# Patient Record
Sex: Male | Born: 1945 | Race: White | Hispanic: No | Marital: Married
Health system: Southern US, Community
[De-identification: ages and names within clinical notes are randomized; demographics above are authoritative.]

## PROBLEM LIST (undated history)

## (undated) DIAGNOSIS — I10 Essential (primary) hypertension: Secondary | ICD-10-CM

## (undated) HISTORY — PX: KNEE SURGERY: SHX244

## (undated) HISTORY — PX: APPENDECTOMY: SHX54

---

## 2001-12-12 ENCOUNTER — Encounter (INDEPENDENT_AMBULATORY_CARE_PROVIDER_SITE_OTHER): Payer: Self-pay | Admitting: *Deleted

## 2001-12-13 ENCOUNTER — Encounter: Payer: Self-pay | Admitting: Emergency Medicine

## 2001-12-13 ENCOUNTER — Observation Stay (HOSPITAL_COMMUNITY): Admission: EM | Admit: 2001-12-13 | Discharge: 2001-12-14 | Payer: Self-pay | Admitting: Emergency Medicine

## 2003-02-06 ENCOUNTER — Inpatient Hospital Stay (HOSPITAL_COMMUNITY): Admission: EM | Admit: 2003-02-06 | Discharge: 2003-02-09 | Payer: Self-pay | Admitting: *Deleted

## 2003-03-15 ENCOUNTER — Encounter: Admission: RE | Admit: 2003-03-15 | Discharge: 2003-03-15 | Payer: Self-pay | Admitting: Family Medicine

## 2003-12-28 ENCOUNTER — Ambulatory Visit (HOSPITAL_COMMUNITY): Admission: RE | Admit: 2003-12-28 | Discharge: 2003-12-28 | Payer: Self-pay | Admitting: Orthopedic Surgery

## 2005-10-25 ENCOUNTER — Ambulatory Visit (HOSPITAL_COMMUNITY): Admission: RE | Admit: 2005-10-25 | Discharge: 2005-10-25 | Payer: Self-pay | Admitting: Gastroenterology

## 2009-06-17 ENCOUNTER — Ambulatory Visit (HOSPITAL_COMMUNITY): Admission: RE | Admit: 2009-06-17 | Discharge: 2009-06-17 | Payer: Self-pay | Admitting: Specialist

## 2009-06-21 ENCOUNTER — Encounter: Admission: RE | Admit: 2009-06-21 | Discharge: 2009-06-21 | Payer: Self-pay | Admitting: Specialist

## 2011-01-19 NOTE — H&P (Signed)
[place]. [facility]  Patient:    Geoffrey Logan, Geoffrey Logan Visit Number: [id number] MRN: [id number]          Service Type: SUR Location: 5700 [id number] Attending Physician:  Arlis Porta Dictated by:   Adolph Pollack, M.D. Admit Date:  12/12/2001 Discharge Date: 12/14/2001   CC:         Delorse Lek, M.D.   History and Physical  CHIEF COMPLAINT: Right mid abdominal pain.  HISTORY OF PRESENT ILLNESS: Geoffrey Logan is a 65 year old male, who states he felt "queasy" last night around 7-8 p.m.  He had something to eat, and began developing worsening pain in the right mid abdomen.  It got so severe he presented to the emergency department.  Extensive evaluation ensued, including gallbladder ultrasound which was negative.  WBC was slightly elevated.  Liver function tests were normal.  He subsequently underwent a CT scan which demonstrated inflammatory changes and thickening of the appendix, which actually was in the right mid abdomen in the right upper quadrant region.  At this point I was asked to see him.  He has had some nausea and vomiting.  PAST MEDICAL HISTORY:  1. Hypertension.  2. Gastroesophageal reflux disease.  3. Nephrolithiasis.  4. Hypercholesterolemia.  PAST SURGICAL HISTORY:  1. Tonsillectomy.  2. Circumcision.  MEDICAL ALLERGIES: SULFA.  MEDICATIONS:  1. Accupril.  2. Aspirin.  3. Niacin.  4. Zantac.  SOCIAL HISTORY: He is married.  He is retired from the [employer].  He denies tobacco or alcohol use.  FAMILY HISTORY: Positive for heart disease in his mother.  Also positive for sugar diabetes.  REVIEW OF SYSTEMS: CARDIOVASCULAR: He states he had a dysrhythmia in the past. No known heart disease.  PULMONARY: No asthma, pneumonia, TB.  GI: No hepatitis, diverticulitis, or jaundice.  ENDOCRINE: No diabetes or thyroid disease.  NEUROLOGIC: No strokes or seizures.  HEMATOLOGIC: No bleeding disorders, blood  clots, or previous transfusions.  PHYSICAL EXAMINATION:  GENERAL: Well-developed, well-nourished male in no acute distress, very pleasant and cooperative.  VITAL SIGNS: Temperature 96.7 degrees, blood pressure 166/82, pulse 50.  HEENT: EOMI.  No icterus.  NECK: Supple without masses.  CARDIOVASCULAR: Heart demonstrates decreased rate with regular rhythm.  RESPIRATORY: Breath sounds equal and clear.  Respirations unlabored.  ABDOMEN: Soft, with mild right mid abdominal tenderness present.  No palpable masses.  No organomegaly present.  EXTREMITIES: Full range of motion.  No cyanosis or edema.  LABORATORY DATA: CT scan demonstrates a retrocecal periappendix in the right upper quadrant region that is thickened, with inflammatory change and an appendicolith is noted as well.  Laboratory data remarkable for WBC of 10,900 with hemoglobin of 14, platelet count 230,000.  Liver function tests normal.  Amylase and lipase normal.  EKG demonstrates sinus bradycardia.  IMPRESSION: Acute appendicitis with slight malrotation.  PLAN: Laparoscopic, possible open, appendectomy.  I did explain the procedure, rationale, and risks to him.  The risks include, but not limited to, bleeding, infection, risks of anesthesia, and accidental injury to abdominal organs including liver and intestine.  He seems to understand this and agreed to proceed. Dictated by:   Adolph Pollack, M.D. Attending Physician:  Arlis Porta DD:  12/13/01 TD:  12/13/01 Job: [id number]

## 2011-01-19 NOTE — Discharge Summary (Signed)
   NAMELUCIA, Geoffrey Logan                          ACCOUNT NO.:  [id number]   MEDICAL RECORD NO.:  [id number]                   PATIENT TYPE:  INP   LOCATION:  5712                                 FACILITY:  [facility]   PHYSICIAN:  Noelle C. Merilynn Finland, M.D.           DATE OF BIRTH:  16-May-1946   DATE OF ADMISSION:  02/06/2003  DATE OF DISCHARGE:  02/09/2003                                 DISCHARGE SUMMARY   MEDICATION LIST:  1. Keflex 500 mg one tab by mouth four times a day for six days to finish     ten day course.  2. Accupril 40 mg one tab by mouth every day.  3. Niaspan 1,000 mg one tab by mouth at bedtime every day.  4. Aspirin 81 mg one tab by mouth every day 30 minutes prior to Niaspan.  5. Ibuprofen 800 mg one tab by mouth every eight hours as needed for pain or     inflammation.   DISCHARGE DIAGNOSES:  1. Right prepatellar bursitis with superficial cellulitis.  2. Hypertension.  3. Gastroesophageal reflux disease.  4. Hyperlipidemia.   DISCHARGE INSTRUCTIONS:  1. No heavy exertion until pain and swelling resolve, then may return to     work.  2. Use pain medications prescribed by Dr. Cleta Alberts.  3. Go to emergency room/call primary medical doctor if knee swells, fever,     shortness of breath, or chest pain occur.  4. Patient to call primary medical doctor, Dr. Doristine Counter, for an appointment     to followup within one weeks' time after discharge.   This is a 65 year old Caucasian male with a history of hypertension, GERD,  hyperlipidemia and presenting with right prepatellar bursitis with  superficial cellulitis.   PROBLEM LIST:  1. Right prepatellar bursitis with superficial cellulitis.  Patient with low     grade fever of 100.7 and leukocytosis at 12.9, treated with IV Ancef and     Tylenol 3 for pain.  Now patient with decreased right knee swelling and     afebrile for two days with improving leukocytosis within normal limits to     11.  Patient placed on p.o. antibiotics,  Keflex 500 mg p.o. q.i.d.  No     arthrocentesis performed.  Right knee kept elevated and ice pack applied     24/7.  Ibuprofen 600 mg to control inflammation/pain.  2. Hypertension controlled on lisinopril.  3.     Hyperlipidemia controlled on Niaspan.  4. Gastroesophageal reflux disease controlled on Pepcid.   Followup appointment to be made by patient at his primary care doctor within  one weeks' time.         Noelle C. Merilynn Finland, M.D.                 Noelle C. Merilynn Finland, M.D.    NCR/MEDQ  D:  02/09/2003  T:  02/09/2003  Job:  [id number]

## 2011-01-19 NOTE — Op Note (Signed)
[place]. [facility]  Patient:    Geoffrey Logan, Geoffrey Logan Visit Number: [id number] MRN: [id number]          Service Type: SUR Location: 5700 5741 01 Attending Physician:  Arlis Porta Dictated by:   Adolph Pollack, M.D. Proc. Date: 12/13/01 Admit Date:  12/12/2001 Discharge Date: 12/14/2001                             Operative Report  PREOPERATIVE DIAGNOSIS:  Acute appendicitis.  POSTOPERATIVE DIAGNOSIS:  Acute appendicitis.  PROCEDURE:  Laparoscopic appendectomy.  SURGEON:  Adolph Pollack, M.D.  ANESTHESIA:  General.  INDICATIONS:  This 65 year old man came in with some right midabdominal pain, and after extensive evaluation was found on CT scan to have acute appendicitis with the appendix lying in the right upper quadrant.  He now presents for appendectomy.  TECHNIQUE:  He was placed supine on the operating table, and a general anesthetic was administered.  The abdomen was shaved.  The Foley catheter was placed in his bladder.  The abdominal wall was sterilely prepped and draped. 0.25% Marcaine with epinephrine was infiltrated in the subumbilical region, and a small subumbilical scar was made through the skin and subcutaneous tissue.  A 1-1.5 cm incision was made in the midline fascia. The peritoneal cavity was entered bluntly and under direct vision.  A Hasson trocar sheath was introduced into the peritoneal cavity, and a pneumoperitoneum was created by insufflation of CO2 gas.  Next, the laparoscope was introduced.  I could see the cecum lying in the right upper quadrant.  There were some adhesions between the cecum and right lower abdominal wall and the omentum in the cecum.  The cecum was not in the right lower quadrant.  Under direct vision, a 5 mm trocar was placed through an epigastric incision and another one through an incision just to the right of the midline.  I then mobilized the cecum by incising its lateral peritoneal  attachment sharply.  I mobilized the omentum off of the cecum by incising some of the adhesive attachments.  I then rotated the cecum and ascending colon toward the medial aspect and identified the appendix and was able to isolate its base.  I then divided the mesoappendix, staying close to the appendix with the harmonic scalpel.  The appendix was then amputated off the cecum with an endo GIA stapler and placed in an endo pouch bag. The appendix was then removed through the subumbilical port in the endo pouch bag.  The subumbilical port was replaced.  Next, I copiously irrigated out the area. No bleeding was noted.  The staple line was solid and intact.  I then evacuated the fluid.  I removed the Hasson trocar and closed the fascial defect by tightening up and tieing down the pursestring suture and adding a single interrupted 0 Vicryl suture as well.  I then removed the remaining two 5 mm trocars and released the pneumoperitoneum. The skin incisions were closed with 4-0 Monocryl subcuticular stitches. Steri-Strips and sterile dressings were applied.  He tolerated the procedure well without any apparent complications and was taken to the recovery room in satisfactory condition. Dictated by:   Adolph Pollack, M.D. Attending Physician:  Arlis Porta DD:  12/13/01 TD:  12/14/01 Job: [id number]

## 2016-09-23 ENCOUNTER — Encounter (HOSPITAL_COMMUNITY): Payer: Self-pay | Admitting: Emergency Medicine

## 2016-09-23 ENCOUNTER — Emergency Department (HOSPITAL_COMMUNITY): Payer: Medicare Other

## 2016-09-23 ENCOUNTER — Emergency Department (HOSPITAL_COMMUNITY)
Admission: EM | Admit: 2016-09-23 | Discharge: 2016-09-23 | Disposition: A | Discharge status: Home / Self Care | Payer: Medicare Other | Attending: Emergency Medicine | Admitting: Emergency Medicine

## 2016-09-23 DIAGNOSIS — R69 Illness, unspecified: Secondary | ICD-10-CM

## 2016-09-23 DIAGNOSIS — J111 Influenza due to unidentified influenza virus with other respiratory manifestations: Secondary | ICD-10-CM | POA: Diagnosis not present

## 2016-09-23 DIAGNOSIS — R05 Cough: Secondary | ICD-10-CM | POA: Diagnosis present

## 2016-09-23 DIAGNOSIS — Z7722 Contact with and (suspected) exposure to environmental tobacco smoke (acute) (chronic): Secondary | ICD-10-CM | POA: Insufficient documentation

## 2016-09-23 HISTORY — DX: Essential (primary) hypertension: I10

## 2016-09-23 LAB — INFLUENZA PANEL BY PCR (TYPE A & B)
Influenza A By PCR: POSITIVE — AB
Influenza B By PCR: NEGATIVE

## 2016-09-23 LAB — COMPREHENSIVE METABOLIC PANEL
ALT: 30 U/L (ref 17–63)
AST: 35 U/L (ref 15–41)
Albumin: 3.8 g/dL (ref 3.5–5.0)
Alkaline Phosphatase: 54 U/L (ref 38–126)
Anion gap: 11 (ref 5–15)
BUN: 25 mg/dL — ABNORMAL HIGH (ref 6–20)
CO2: 27 mmol/L (ref 22–32)
Calcium: 8.7 mg/dL — ABNORMAL LOW (ref 8.9–10.3)
Chloride: 99 mmol/L — ABNORMAL LOW (ref 101–111)
Creatinine, Ser: 1.37 mg/dL — ABNORMAL HIGH (ref 0.61–1.24)
GFR calc Af Amer: 58 mL/min — ABNORMAL LOW (ref >60)
GFR calc non Af Amer: 50 mL/min — ABNORMAL LOW (ref >60)
Glucose, Bld: 121 mg/dL — ABNORMAL HIGH (ref 65–99)
Potassium: 3.5 mmol/L (ref 3.5–5.1)
Sodium: 137 mmol/L (ref 135–145)
Total Bilirubin: 0.6 mg/dL (ref 0.3–1.2)
Total Protein: 7.4 g/dL (ref 6.5–8.1)

## 2016-09-23 LAB — URINALYSIS, ROUTINE W REFLEX MICROSCOPIC
Bilirubin Urine: NEGATIVE
GLUCOSE, UA: NEGATIVE mg/dL
Hgb urine dipstick: NEGATIVE
KETONES UR: NEGATIVE mg/dL
LEUKOCYTES UA: NEGATIVE
NITRITE: NEGATIVE
PROTEIN: NEGATIVE mg/dL
Specific Gravity, Urine: 1.025 (ref 1.005–1.030)
pH: 6 (ref 5.0–8.0)

## 2016-09-23 LAB — CBC WITH DIFFERENTIAL/PLATELET
Basophils Absolute: 0 10*3/uL (ref 0.0–0.1)
Basophils Relative: 0 %
Eosinophils Absolute: 0.3 10*3/uL (ref 0.0–0.7)
Eosinophils Relative: 5 %
HCT: 41.4 % (ref 39.0–52.0)
Hemoglobin: 13.9 g/dL (ref 13.0–17.0)
Lymphocytes Relative: 14 %
Lymphs Abs: 0.8 10*3/uL (ref 0.7–4.0)
MCH: 29.9 pg (ref 26.0–34.0)
MCHC: 33.6 g/dL (ref 30.0–36.0)
MCV: 89 fL (ref 78.0–100.0)
Monocytes Absolute: 0.7 10*3/uL (ref 0.1–1.0)
Monocytes Relative: 12 %
Neutro Abs: 4.2 10*3/uL (ref 1.7–7.7)
Neutrophils Relative %: 69 %
Platelets: 168 10*3/uL (ref 150–400)
RBC: 4.65 MIL/uL (ref 4.22–5.81)
RDW: 14.2 % (ref 11.5–15.5)
WBC: 6 10*3/uL (ref 4.0–10.5)

## 2016-09-23 LAB — I-STAT CG4 LACTIC ACID, ED: Lactic Acid, Venous: 1.2 mmol/L (ref 0.5–1.9)

## 2016-09-23 MED ORDER — SODIUM CHLORIDE 0.9 % IV BOLUS (SEPSIS)
1000.0000 mL | Freq: Once | INTRAVENOUS | Status: AC
  Administered 2016-09-23: 1000 mL via INTRAVENOUS

## 2016-09-23 MED ORDER — ACETAMINOPHEN 325 MG PO TABS
650.0000 mg | ORAL_TABLET | Freq: Once | ORAL | Status: AC
  Administered 2016-09-23: 650 mg via ORAL
  Filled 2016-09-23: qty 2

## 2016-09-23 NOTE — ED Triage Notes (Signed)
Pt here with cough/flu symptoms-- his wife is here in trauma C with similar symptoms-- he is primary caregiver for her--

## 2016-09-23 NOTE — ED Provider Notes (Signed)
[facility]-EMERGENCY DEPT Provider Note   CSN: [id number] Arrival date & time: 09/23/16  1012     History   Chief Complaint Chief Complaint  Patient presents with  . Cough  . flu like symptoms    HPI Geoffrey Logan is a 71 y.o. male.  The history is provided by the patient.  Cough  This is a new problem. Episode onset: 4-5 days ago. The problem occurs constantly. The problem has not changed since onset.The cough is non-productive. The maximum temperature recorded prior to his arrival was 101 to 101.9 F. The fever has been present for 3 to 4 days. Associated symptoms include myalgias. Pertinent negatives include no chest pain, no weight loss, no sore throat, no shortness of breath and no wheezing. Associated symptoms comments: Some nausea/gagging and diarrhea 2 days ago which has resolved but anorexia and minimal intake. He has tried nothing for the symptoms. The treatment provided no relief. Risk factors: family member sick with similar sx. He is not a smoker (around second hand smoke). His past medical history does not include pneumonia, bronchiectasis, COPD or asthma. Past medical history comments: has recieved flu shot and pneumonia shot.    Past Medical History:  Diagnosis Date  . Hypertension     There are no active problems to display for this patient.   Past Surgical History:  Procedure Laterality Date  . APPENDECTOMY    . KNEE SURGERY         Home Medications    Prior to Admission medications   Not on File    Family History No family history on file.  Social History Social History  Substance Use Topics  . Smoking status: Passive Smoke Exposure - Never Smoker  . Smokeless tobacco: Former Neurosurgeon  . Alcohol use No     Allergies   Patient has no allergy information on record.   Review of Systems Review of Systems  Constitutional: Negative for weight loss.  HENT: Negative for sore throat.   Respiratory: Positive for cough. Negative for shortness of breath  and wheezing.   Cardiovascular: Negative for chest pain.  Musculoskeletal: Positive for myalgias.  All other systems reviewed and are negative.    Physical Exam Updated Vital Signs BP 140/85 (BP Location: Left Arm)   Pulse 83   Temp (S) 101.4 F (38.6 C) (Oral)   Resp 18   Ht 5\' 8"  (1.727 m)   Wt 203 lb (92.1 kg)   SpO2 97%   BMI 30.87 kg/m   Physical Exam  Constitutional: He is oriented to person, place, and time. He appears well-developed and well-nourished. No distress.  HENT:  Head: Normocephalic and atraumatic.  Right Ear: A middle ear effusion is present.  Left Ear: Tympanic membrane normal.  Mouth/Throat: Oropharynx is clear and moist.  Eyes: Conjunctivae and EOM are normal. Pupils are equal, round, and reactive to light.  Neck: Normal range of motion. Neck supple.  Cardiovascular: Normal rate, regular rhythm and intact distal pulses.   No murmur heard. Pulmonary/Chest: Effort normal and breath sounds normal. No respiratory distress. He has no wheezes. He has no rales.  Abdominal: Soft. He exhibits no distension. There is no tenderness. There is no rebound and no guarding.  Musculoskeletal: Normal range of motion. He exhibits no edema or tenderness.  Neurological: He is alert and oriented to person, place, and time.  Skin: Skin is warm and dry. No rash noted. No erythema.  Psychiatric: He has a normal mood and affect. His behavior  is normal.  Nursing note and vitals reviewed.    ED Treatments / Results  Labs (all labs ordered are listed, but only abnormal results are displayed) Labs Reviewed  COMPREHENSIVE METABOLIC PANEL - Abnormal; Notable for the following:       Result Value   Chloride 99 (*)    Glucose, Bld 121 (*)    BUN 25 (*)    Creatinine, Ser 1.37 (*)    Calcium 8.7 (*)    GFR calc non Af Amer 50 (*)    GFR calc Af Amer 58 (*)    All other components within normal limits  CBC WITH DIFFERENTIAL/PLATELET  URINALYSIS, ROUTINE W REFLEX MICROSCOPIC    INFLUENZA PANEL BY PCR (TYPE A & B)  I-STAT CG4 LACTIC ACID, ED    EKG  EKG Interpretation None       Radiology Dg Chest 2 View  Result Date: 09/23/2016 CLINICAL DATA:  Congestion and fever. EXAM: CHEST  2 VIEW COMPARISON:  None. FINDINGS: The heart size and mediastinal contours are within normal limits. Both lungs are clear. The visualized skeletal structures are unremarkable. IMPRESSION: No active cardiopulmonary disease. Electronically Signed   By: Gerome Samavid  Williams III M.D   On: 09/23/2016 11:30    Procedures Procedures (including critical care time)  Medications Ordered in ED Medications  sodium chloride 0.9 % bolus 1,000 mL (not administered)  acetaminophen (TYLENOL) tablet 650 mg (650 mg Oral Given 09/23/16 1129)     Initial Impression / Assessment and Plan / ED Course  I have reviewed the triage vital signs and the nursing notes.  Pertinent labs & imaging results that were available during my care of the patient were reviewed by me and considered in my medical decision making (see chart for details).     Pt with symptoms consistent with influenza.  Normal exam here but is febrile.  No signs of breathing difficulty  No signs of strep pharyngitis, otitis or abnormal abdominal findings.   CXR wnl.  Pt given some IVF here due to dehydration as poor PO intact.  Flu swab pending.  Will continue antipyretica and rest and fluids and return for any further problems.  1:06 PM CBC, lactate, UA all within normal limits. Mild AKI today of 1.38 and patient feeling better after fluids. We'll discharge patient home and he was given strict return precautions.  Pt outside of window for tamiflu.  Final Clinical Impressions(s) / ED Diagnoses   Final diagnoses:  Influenza-like illness    New Prescriptions New Prescriptions   No medications on file     Gwyneth SproutWhitney Tura Roller, MD 09/23/16 1309

## 2018-01-10 IMAGING — DX DG CHEST 2V
2 series · 2 of 2 positions shown · non-contrast
Comparison: None.

CLINICAL DATA: Congestion and fever.

EXAM:
CHEST  2 VIEW

[w chest pa]
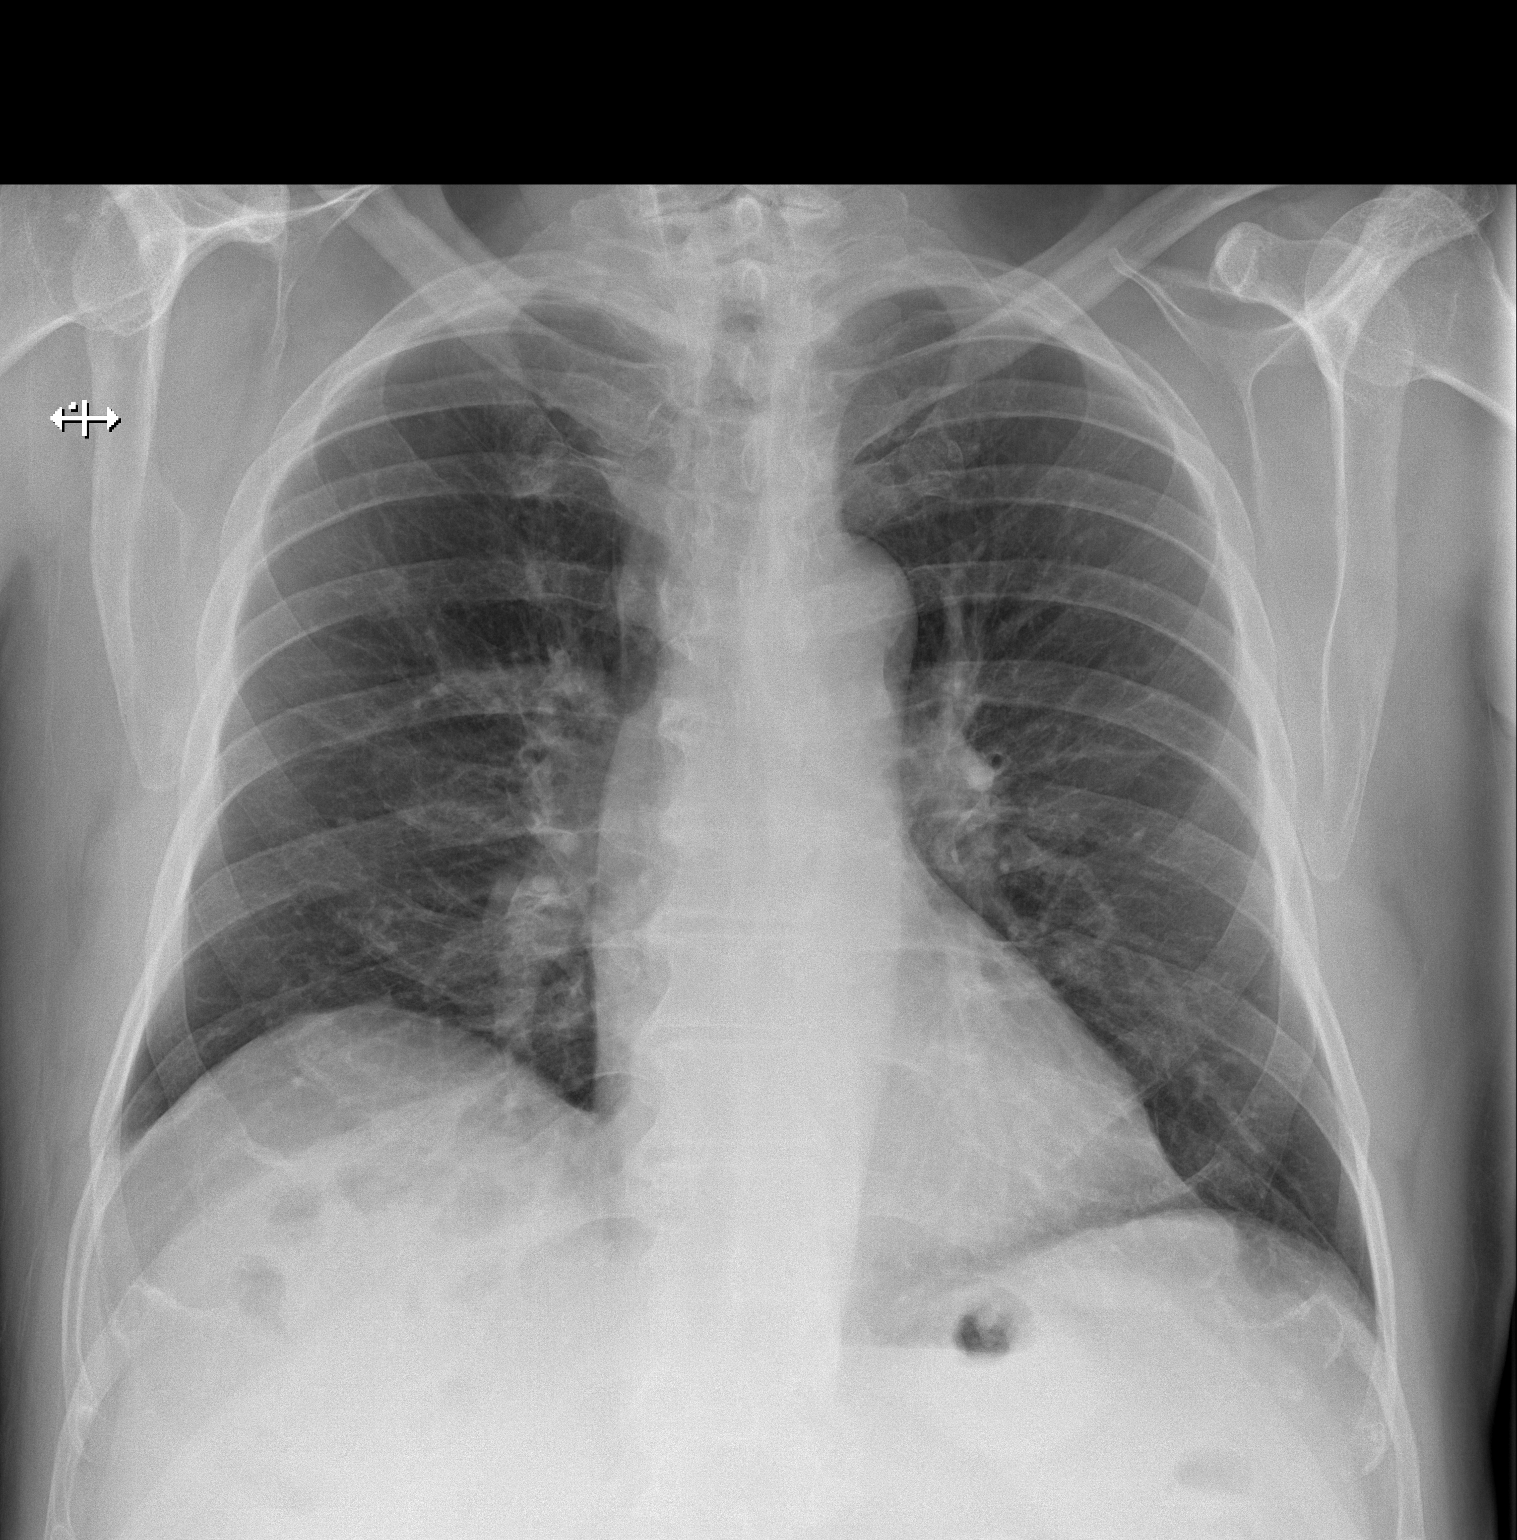

[w chest lat]
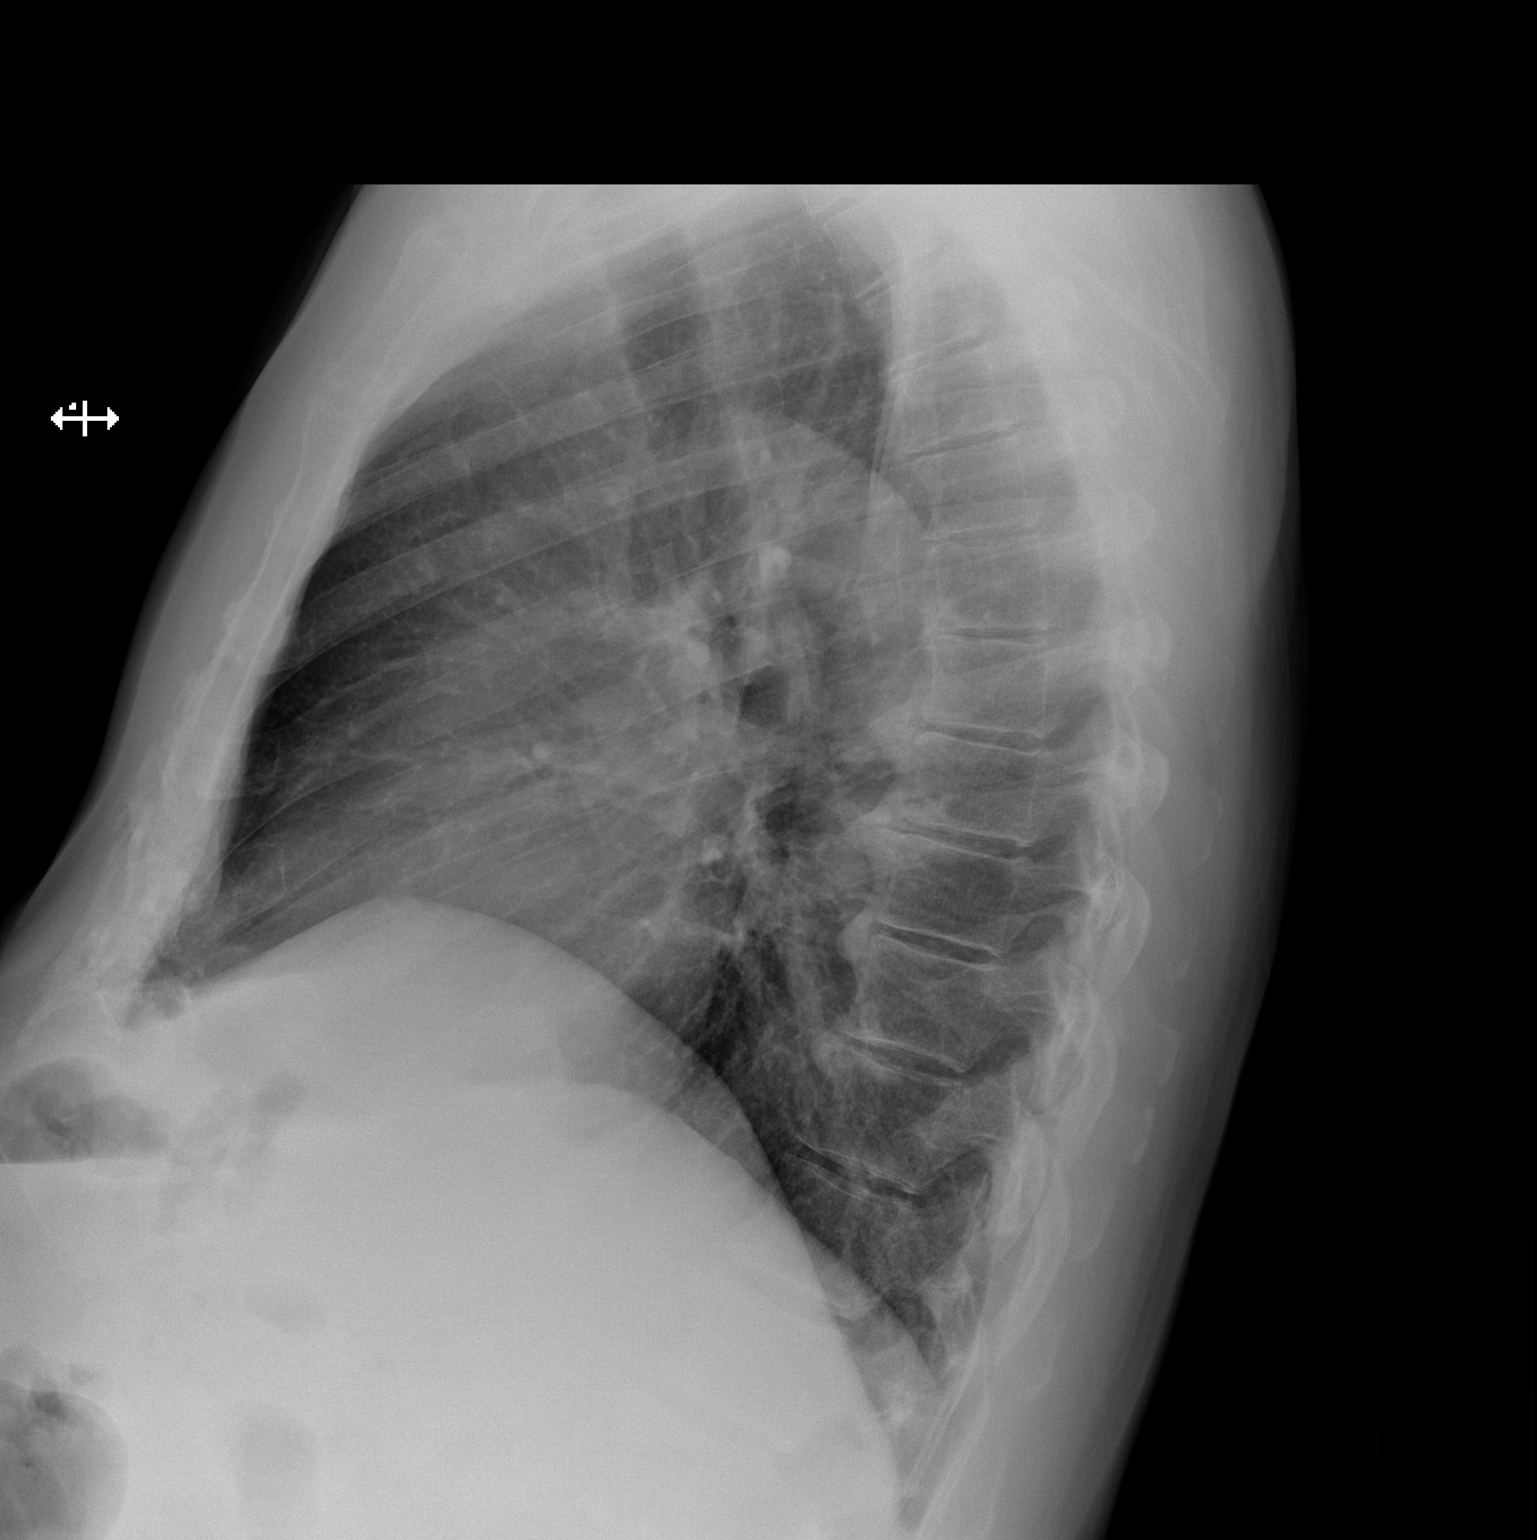

[2 of 2 positions shown; findings below may reference images not displayed]

FINDINGS: The heart size and mediastinal contours are within normal limits.
Both lungs are clear. The visualized skeletal structures are
unremarkable.
IMPRESSION: No active cardiopulmonary disease.
